# Patient Record
Sex: Male | Born: 1995 | Race: Black or African American | Hispanic: No | Marital: Single | Smoking: Never smoker
Health system: Southern US, Community
[De-identification: ages and names within clinical notes are randomized; demographics above are authoritative.]

## PROBLEM LIST (undated history)

## (undated) HISTORY — PX: OTHER SURGICAL HISTORY: SHX169

---
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
## 2017-09-23 ENCOUNTER — Other Ambulatory Visit: Payer: Self-pay

## 2017-09-23 ENCOUNTER — Encounter (HOSPITAL_COMMUNITY): Payer: Self-pay

## 2017-09-23 ENCOUNTER — Emergency Department (HOSPITAL_COMMUNITY): Payer: No Typology Code available for payment source

## 2017-09-23 DIAGNOSIS — Y990 Civilian activity done for income or pay: Secondary | ICD-10-CM | POA: Diagnosis not present

## 2017-09-23 DIAGNOSIS — M79672 Pain in left foot: Secondary | ICD-10-CM | POA: Diagnosis not present

## 2017-09-23 DIAGNOSIS — W208XXA Other cause of strike by thrown, projected or falling object, initial encounter: Secondary | ICD-10-CM | POA: Insufficient documentation

## 2017-09-23 DIAGNOSIS — Y9289 Other specified places as the place of occurrence of the external cause: Secondary | ICD-10-CM | POA: Insufficient documentation

## 2017-09-23 DIAGNOSIS — Z79899 Other long term (current) drug therapy: Secondary | ICD-10-CM | POA: Diagnosis not present

## 2017-09-23 DIAGNOSIS — Y9389 Activity, other specified: Secondary | ICD-10-CM | POA: Diagnosis not present

## 2017-09-23 NOTE — ED Triage Notes (Signed)
Pt states he was at work and got his left foot caught under some furniture. Pt ambulatory to triage.

## 2017-09-24 ENCOUNTER — Emergency Department (HOSPITAL_COMMUNITY)
Admission: EM | Admit: 2017-09-24 | Discharge: 2017-09-24 | Disposition: A | Discharge status: Home / Self Care | Payer: No Typology Code available for payment source | Attending: Emergency Medicine | Admitting: Emergency Medicine

## 2017-09-24 DIAGNOSIS — M79672 Pain in left foot: Secondary | ICD-10-CM

## 2017-09-24 MED ORDER — IBUPROFEN 800 MG PO TABS
800.0000 mg | ORAL_TABLET | Freq: Once | ORAL | Status: AC
  Administered 2017-09-24: 800 mg via ORAL
  Filled 2017-09-24: qty 1

## 2017-09-24 NOTE — ED Provider Notes (Signed)
[facility]-EMERGENCY DEPT Provider Note   CSN: [id number] Arrival date & time: 09/23/17  1633     History   Chief Complaint Chief Complaint  Patient presents with  . Foot Pain    HPI Marco Weaver is a 22 y.o. male presenting for evaluation of left foot pain.  Patient states he was at work when the table trolley flipped over and landed on the dorsal aspect of his left foot.  He reports acute onset of pain, which has been persistent since.  He is ambulatory.  Pain is worse with ambulation and palpation.  He denies numbness or tingling.  No injury elsewhere.  He is not on blood thinners.  He has not taken anything for pain including Tylenol or ibuprofen.  He states that work sent him here for evaluation.  He has no other medical problems, does not take medications daily.  HPI  History reviewed. No pertinent past medical history.  There are no active problems to display for this patient.   Past Surgical History:  Procedure Laterality Date  . history of testicular surgery          Home Medications    Prior to Admission medications   Medication Sig Start Date End Date Taking? Authorizing Provider  levETIRAcetam (KEPPRA) 100 MG/ML solution Take 500 mg by mouth 2 (two) times daily.     [provider]    Family History No family history on file.  Social History Social History   Tobacco Use  . Smoking status: Never Smoker  . Smokeless tobacco: Never Used  Substance Use Topics  . Alcohol use: No  . Drug use: No     Allergies   Patient has no known allergies.   Review of Systems Review of Systems  Musculoskeletal: Positive for arthralgias.  Neurological: Negative for numbness.  Hematological: Does not bruise/bleed easily.     Physical Exam Updated Vital Signs BP 108/72 (BP Location: Left Arm)   Pulse 60   Temp 98.5 F (36.9 C) (Oral)   Resp 18   Ht  (1.727 m)   Wt 70.3 kg (155 lb)   SpO2 100%   BMI 23.57  kg/m   Physical Exam  Constitutional: He is oriented to person, place, and time. He appears well-developed and well-nourished. No distress.  HENT:  Head: Normocephalic and atraumatic.  Eyes: EOM are normal.  Neck: Normal range of motion.  Pulmonary/Chest: Effort normal.  Abdominal: He exhibits no distension.  Musculoskeletal: Normal range of motion. He exhibits tenderness. He exhibits no edema or deformity.  No obvious deformity or swelling of the foot.  Mild tenderness to palpation of the dorsal left foot along the first metatarsal.  Pedal pulses intact bilaterally.  Sensation intact bilaterally.  Good cap refill.  Patient is ambulatory.  Neurological: He is alert and oriented to person, place, and time. No sensory deficit.  Skin: Skin is warm. No rash noted.  Psychiatric: He has a normal mood and affect.  Nursing note and vitals reviewed.    ED Treatments / Results  Labs (all labs ordered are listed, but only abnormal results are displayed) Labs Reviewed - No data to display  EKG None  Radiology Dg Foot Complete Left  Result Date: 09/23/2017 CLINICAL DATA:  Blunt trauma to left foot with pain, initial encounter EXAM: LEFT FOOT - COMPLETE 3+ VIEW COMPARISON:  None. FINDINGS: There is no evidence of fracture or dislocation. There is no evidence of arthropathy or other focal bone abnormality.  Soft tissues are unremarkable. IMPRESSION: No acute abnormality noted. Electronically Signed   By: Alcide Clever M.D.   On: 09/23/2017 18:49    Procedures Procedures (including critical care time)  Medications Ordered in ED Medications  ibuprofen (ADVIL,MOTRIN) tablet 800 mg (has no administration in time range)     Initial Impression / Assessment and Plan / ED Course  I have reviewed the triage vital signs and the nursing notes.  Pertinent labs & imaging results that were available during my care of the patient were reviewed by me and considered in my medical decision making (see  chart for details).     Patient presenting for evaluation of left foot pain.  Physical exam reassuring, he is neurovascularly intact.  X-ray reviewed and interpreted by me, no sign of fracture or dislocation.  Doubt lisfranc injury. Likely msk bruise.  Will treat with NSAIDs and ice.  ASO for support and compression.  At this time, patient present for discharge.  Return precautions given.  Patient states he understands and agrees to plan.  Final Clinical Impressions(s) / ED Diagnoses   Final diagnoses:  Left foot pain    ED Discharge Orders    None       Alveria Apley, PA-C 09/24/17 0041    Dione Booze, MD 09/24/17 [id number]

## 2017-09-24 NOTE — Discharge Instructions (Signed)
Take ibuprofen 3 times a day with meals.  Do not take other anti-inflammatories at the same time open (Advil, Motrin, naproxen, Aleve). You may supplement with Tylenol if you need further pain control. Use ice packs or heating pads if this helps control your pain. Use the ankle brace if it helps.  Pain will likely persist for the next several days. If it is not improving in a week, follow up fir reevaluation.  Return to the ER if pain worsens significantly, you cannot feel your foot, your foot turns white, or with any new or concerning symptoms.

## 2019-04-19 IMAGING — CR DG FOOT COMPLETE 3+V*L*
3 series · 3 of 3 positions shown · non-contrast
Comparison: None.

CLINICAL DATA: Blunt trauma to left foot with pain, initial
encounter

EXAM:
LEFT FOOT - COMPLETE 3+ VIEW

[x foot ap left]
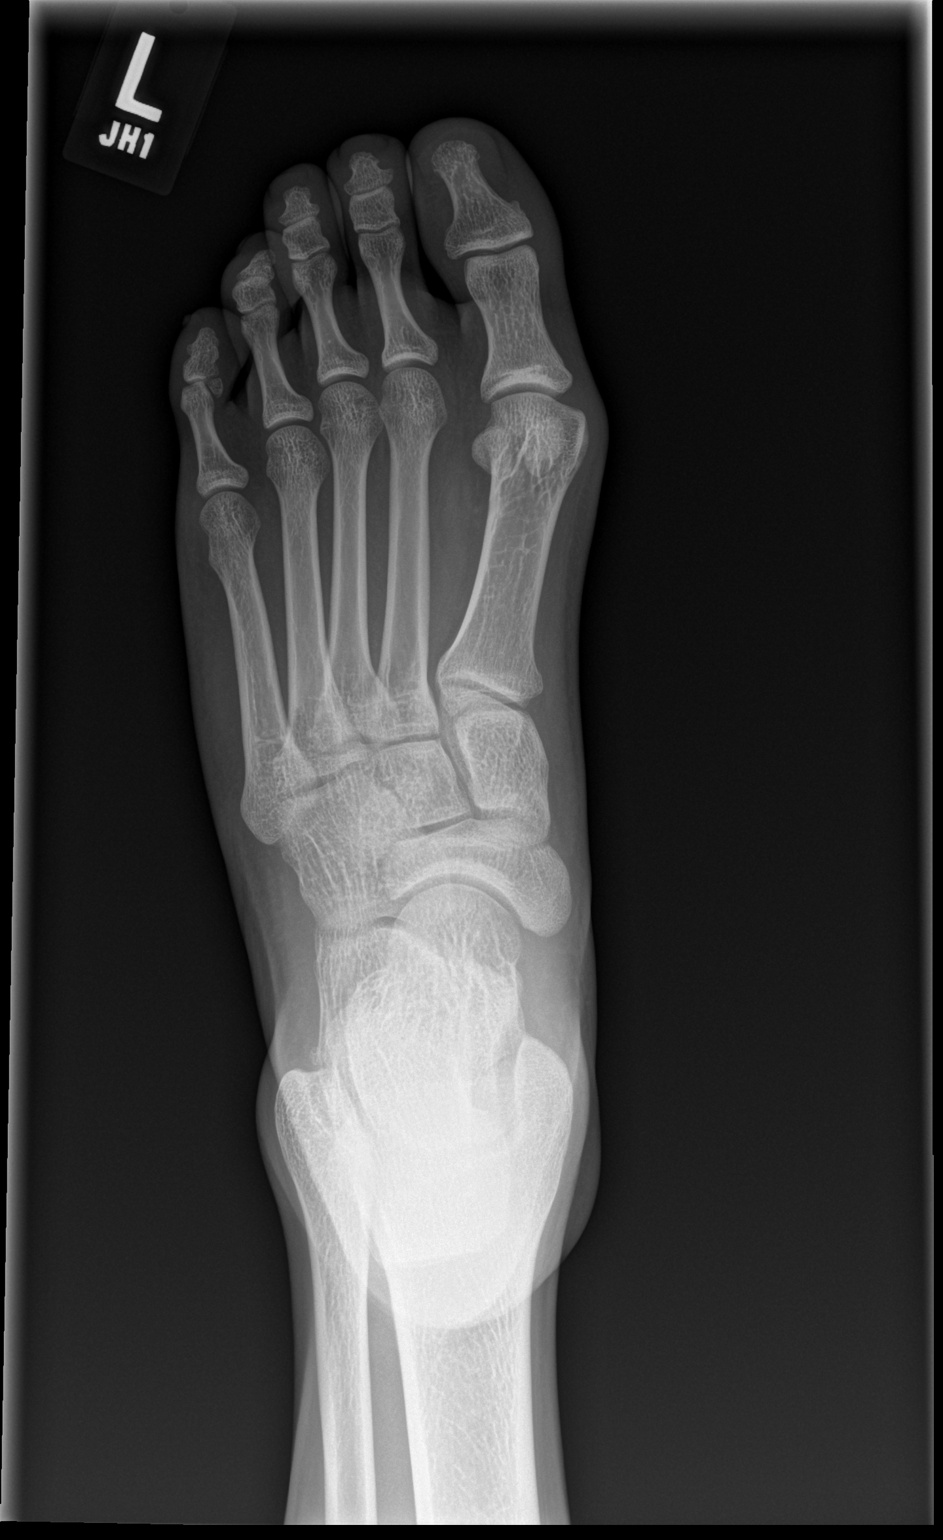

[x foot obl left]
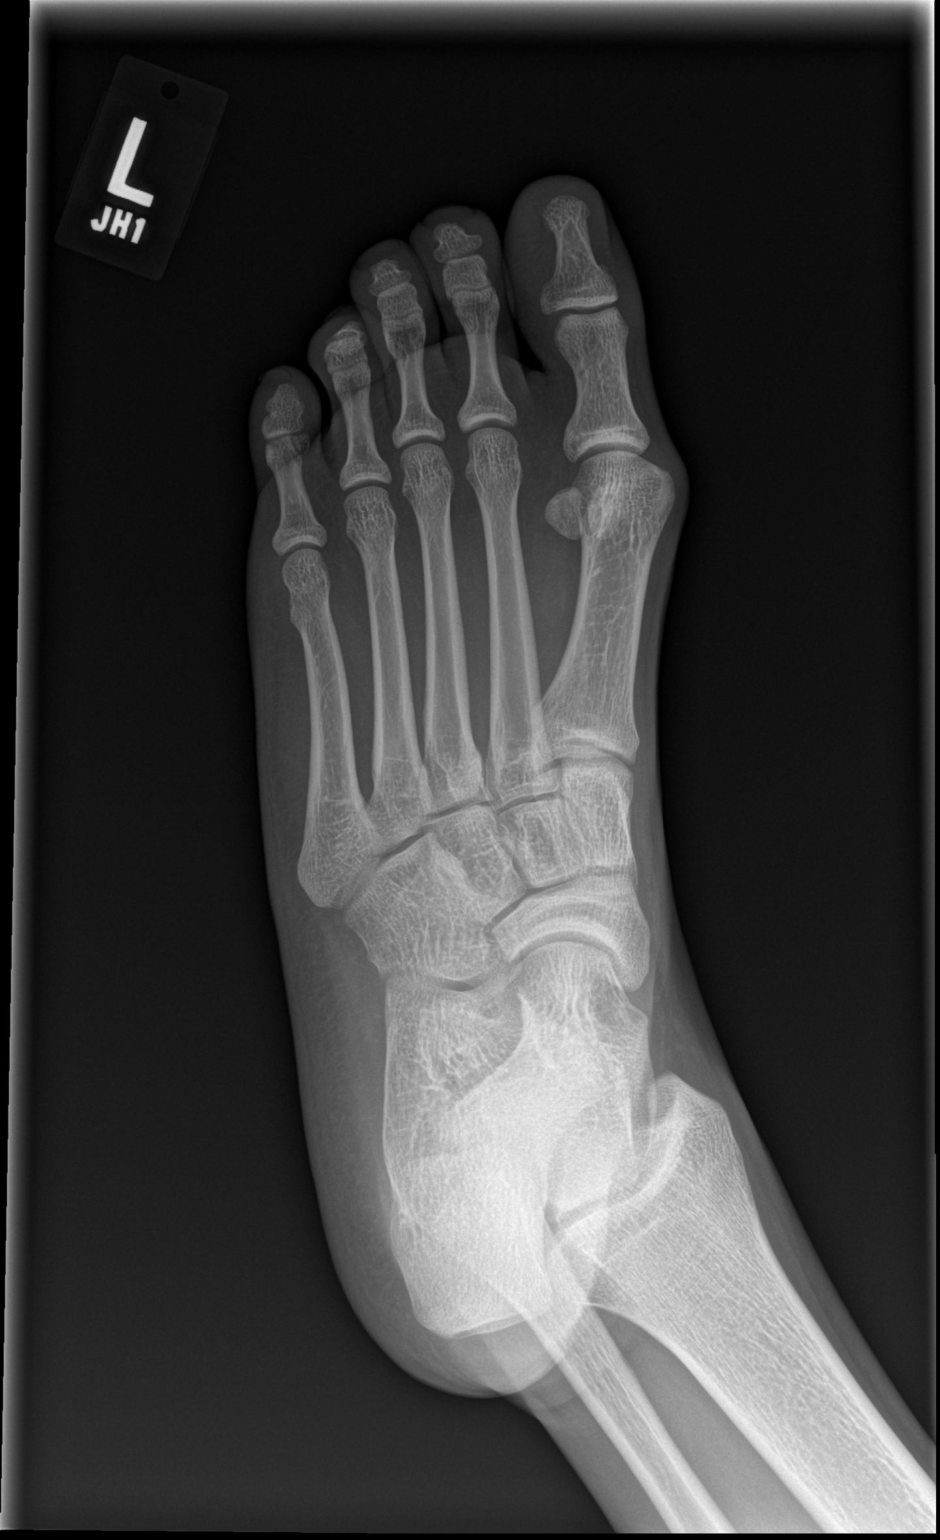

[x foot lat left]
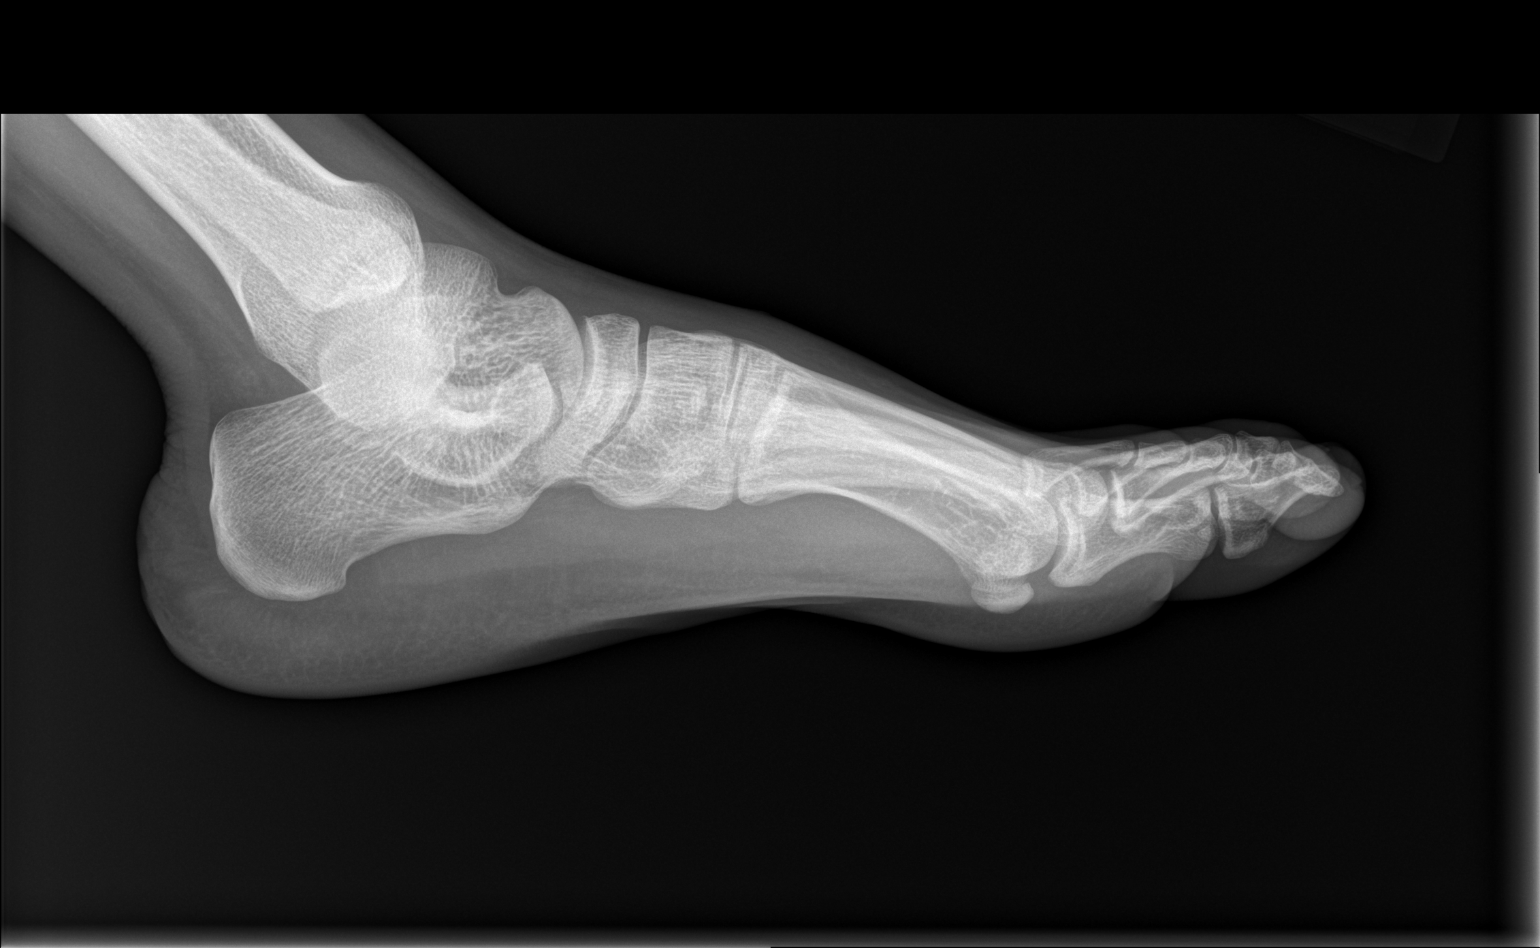

[3 of 3 positions shown; findings below may reference images not displayed]

FINDINGS: There is no evidence of fracture or dislocation. There is no
evidence of arthropathy or other focal bone abnormality. Soft
tissues are unremarkable.
IMPRESSION: No acute abnormality noted.

## 2022-11-01 ENCOUNTER — Other Ambulatory Visit: Payer: Self-pay

## 2022-11-01 ENCOUNTER — Emergency Department (HOSPITAL_COMMUNITY)
Admission: EM | Admit: 2022-11-01 | Discharge: 2022-11-01 | Disposition: A | Discharge status: Home / Self Care | Payer: Medicaid Other | Attending: Emergency Medicine | Admitting: Emergency Medicine

## 2022-11-01 ENCOUNTER — Encounter (HOSPITAL_COMMUNITY): Payer: Self-pay

## 2022-11-01 ENCOUNTER — Emergency Department (HOSPITAL_COMMUNITY): Payer: Medicaid Other

## 2022-11-01 DIAGNOSIS — R042 Hemoptysis: Secondary | ICD-10-CM | POA: Diagnosis present

## 2022-11-01 LAB — COMPREHENSIVE METABOLIC PANEL
ALT: 20 U/L (ref 0–44)
AST: 15 U/L (ref 15–41)
Albumin: 3.3 g/dL — ABNORMAL LOW (ref 3.5–5.0)
Alkaline Phosphatase: 52 U/L (ref 38–126)
Anion gap: 6 (ref 5–15)
BUN: 15 mg/dL (ref 6–20)
CO2: 24 mmol/L (ref 22–32)
Calcium: 8.4 mg/dL — ABNORMAL LOW (ref 8.9–10.3)
Chloride: 107 mmol/L (ref 98–111)
Creatinine, Ser: 0.88 mg/dL (ref 0.61–1.24)
GFR, Estimated: >60 mL/min (ref >60)
Glucose, Bld: 105 mg/dL — ABNORMAL HIGH (ref 70–99)
Potassium: 3.7 mmol/L (ref 3.5–5.1)
Sodium: 137 mmol/L (ref 135–145)
Total Bilirubin: 0.5 mg/dL (ref 0.3–1.2)
Total Protein: 7.5 g/dL (ref 6.5–8.1)

## 2022-11-01 LAB — CBC
HCT: 39.4 % (ref 39.0–52.0)
Hemoglobin: 12.4 g/dL — ABNORMAL LOW (ref 13.0–17.0)
MCH: 29.4 pg (ref 26.0–34.0)
MCHC: 31.5 g/dL (ref 30.0–36.0)
MCV: 93.4 fL (ref 80.0–100.0)
Platelets: 325 10*3/uL (ref 150–400)
RBC: 4.22 MIL/uL (ref 4.22–5.81)
RDW: 13.3 % (ref 11.5–15.5)
WBC: 8.5 10*3/uL (ref 4.0–10.5)
nRBC: 0 % (ref 0.0–0.2)

## 2022-11-01 LAB — D-DIMER, QUANTITATIVE: D-Dimer, Quant: 0.32 ug/mL-FEU (ref 0.00–0.50)

## 2022-11-01 MED ORDER — DOXYCYCLINE HYCLATE 100 MG PO CAPS: 100.0000 mg | ORAL_CAPSULE | Freq: Two times a day (BID) | ORAL | 0 refills | Status: AC

## 2022-11-01 NOTE — ED Provider Notes (Signed)
[name] Provider Note   CSN: [id number] Arrival date & time: 11/01/22  1701     History  Chief Complaint  Patient presents with   Hemoptysis    Marco Weaver is a 27 y.o. male.  27 yo M with chief complaints of coughing up blood.  This been going on for the better part of a month.  He denies exposure to tuberculosis.  Was born in the [place].  No history of asthma.  Denies smoking history.  Has some pain with deep inspiration.        Home Medications Prior to Admission medications   Medication Sig Start Date End Date Taking? Authorizing Provider  doxycycline (VIBRAMYCIN) 100 MG capsule Take 1 capsule (100 mg total) by mouth 2 (two) times daily. One po bid x 7 days 11/01/22  Yes Marco Plan, DO  levETIRAcetam (KEPPRA) 100 MG/ML solution Take 500 mg by mouth 2 (two) times daily.     [provider]      Allergies    Patient has no known allergies.    Review of Systems   Review of Systems  Physical Exam Updated Vital Signs BP 101/61   Pulse 68   Temp 98.4 F (36.9 C) (Oral)   Resp 17   Ht 5\' 7"  (1.702 m)   Wt 63.5 kg   SpO2 100%   BMI 21.93 kg/m  Physical Exam Vitals and nursing note reviewed.  Constitutional:      Appearance: He is well-developed.  HENT:     Head: Normocephalic and atraumatic.  Eyes:     Pupils: Pupils are equal, round, and reactive to light.  Neck:     Vascular: No JVD.  Cardiovascular:     Rate and Rhythm: Normal rate and regular rhythm.     Heart sounds: No murmur heard.    No friction rub. No gallop.  Pulmonary:     Effort: No respiratory distress.     Breath sounds: No wheezing.  Abdominal:     General: There is no distension.     Tenderness: There is no abdominal tenderness. There is no guarding or rebound.  Musculoskeletal:        General: Normal range of motion.     Cervical back: Normal range of motion and neck supple.  Skin:    Coloration: Skin is not  pale.     Findings: No rash.  Neurological:     Mental Status: He is alert and oriented to person, place, and time.  Psychiatric:        Behavior: Behavior normal.     ED Results / Procedures / Treatments   Labs (all labs ordered are listed, but only abnormal results are displayed) Labs Reviewed  COMPREHENSIVE METABOLIC PANEL - Abnormal; Notable for the following components:      Result Value   Glucose, Bld 105 (*)    Calcium 8.4 (*)    Albumin 3.3 (*)    All other components within normal limits  CBC - Abnormal; Notable for the following components:   Hemoglobin 12.4 (*)    All other components within normal limits  D-DIMER, QUANTITATIVE    EKG None  Radiology DG Chest Port 1 View  Result Date: 11/01/2022 CLINICAL DATA:  Question hemoptysis EXAM: PORTABLE CHEST 1 VIEW COMPARISON:  Chest x-ray 05/13/2014 FINDINGS: The heart size and mediastinal contours are within normal limits. There some patchy opacities in the left lung base in the retrocardiac region.  Costophrenic angles are clear. No pneumothorax. Visualized skeletal structures are unremarkable. IMPRESSION: Patchy opacities in the left lung base in the retrocardiac region, may represent atelectasis or infiltrate. Electronically Signed   By: Darliss Cheney M.D.   On: 11/01/2022 20:07    Procedures Procedures    Medications Ordered in ED Medications - No data to display  ED Course/ Medical Decision Making/ A&P                             Medical Decision Making Amount and/or Complexity of Data Reviewed Labs: ordered. Radiology: ordered.  Risk Prescription drug management.   27 yo M with a chief complaints of coughing up blood.  This been going on for about a month.  He is well-appearing nontoxic.  No significant anemia or leukocytosis.  He does have some pain with deep inspiration.  I feel he is low risk for a PE.  No tachycardia no hypoxia.  Will obtain a D-dimer here.  D-dimer is negative.  Will treat with a  course of doxycycline.  PCP follow-up.  10:08 PM:  I have discussed the diagnosis/risks/treatment options with the patient and family.  Evaluation and diagnostic testing in the emergency department does not suggest an emergent condition requiring admission or immediate intervention beyond what has been performed at this time.  They will follow up with PCP. We also discussed returning to the ED immediately if new or worsening sx occur. We discussed the sx which are most concerning (e.g., sudden worsening pain, fever, inability to tolerate by mouth) that necessitate immediate return. Medications administered to the patient during their visit and any new prescriptions provided to the patient are listed below.  Medications given during this visit Medications - No data to display   The patient appears reasonably screen and/or stabilized for discharge and I doubt any other medical condition or other [facility] requiring further screening, evaluation, or treatment in the ED at this time prior to discharge.          Final Clinical Impression(s) / ED Diagnoses Final diagnoses:  Hemoptysis    Rx / DC Orders ED Discharge Orders          Ordered    doxycycline (VIBRAMYCIN) 100 MG capsule  2 times daily        11/01/22 2051              Marco Plan, DO 11/01/22 2208

## 2022-11-01 NOTE — Discharge Instructions (Signed)
Follow up with your PCP in the office.  Return for worsening symptoms.  °

## 2022-11-01 NOTE — ED Notes (Signed)
Patient given discharge instructions and follow up care. Patient verbalized understanding. IV removed with catheter intact. Patient ambulatory out of ED. 

## 2022-11-01 NOTE — ED Triage Notes (Signed)
Patient reports that he has been coughing up blood for about a month. Reports that it is bright red and color. States he went to his PCP for this and was told to come to the ER. Patient has no other complaints.
